# Patient Record
Sex: Male | Born: 1955 | Race: White | Hispanic: No | Marital: Married
Health system: Southern US, Community
[De-identification: ages and names within clinical notes are randomized; demographics above are authoritative.]

---

## 1997-11-13 ENCOUNTER — Other Ambulatory Visit: Admission: RE | Admit: 1997-11-13 | Discharge: 1997-11-13 | Payer: Self-pay | Admitting: Orthopedic Surgery

## 1997-12-05 ENCOUNTER — Ambulatory Visit (HOSPITAL_BASED_OUTPATIENT_CLINIC_OR_DEPARTMENT_OTHER): Admission: RE | Admit: 1997-12-05 | Discharge: 1997-12-05 | Payer: Self-pay | Admitting: Orthopedic Surgery

## 2002-12-10 ENCOUNTER — Emergency Department (HOSPITAL_COMMUNITY): Admission: EM | Admit: 2002-12-10 | Discharge: 2002-12-10 | Payer: Self-pay | Admitting: Emergency Medicine

## 2002-12-10 ENCOUNTER — Encounter: Payer: Self-pay | Admitting: Emergency Medicine

## 2004-12-31 ENCOUNTER — Emergency Department (HOSPITAL_COMMUNITY): Admission: EM | Admit: 2004-12-31 | Discharge: 2004-12-31 | Payer: Self-pay | Admitting: Emergency Medicine

## 2005-01-01 ENCOUNTER — Emergency Department (HOSPITAL_COMMUNITY): Admission: EM | Admit: 2005-01-01 | Discharge: 2005-01-01 | Payer: Self-pay | Admitting: Emergency Medicine

## 2005-03-29 ENCOUNTER — Emergency Department (HOSPITAL_COMMUNITY): Admission: EM | Admit: 2005-03-29 | Discharge: 2005-03-29 | Payer: Medicare Other | Admitting: Emergency Medicine

## 2016-05-01 DIAGNOSIS — M109 Gout, unspecified: Secondary | ICD-10-CM

## 2016-05-01 DIAGNOSIS — I10 Essential (primary) hypertension: Secondary | ICD-10-CM | POA: Diagnosis not present

## 2016-05-01 DIAGNOSIS — E785 Hyperlipidemia, unspecified: Secondary | ICD-10-CM

## 2016-05-01 DIAGNOSIS — J9601 Acute respiratory failure with hypoxia: Secondary | ICD-10-CM

## 2016-05-01 DIAGNOSIS — J40 Bronchitis, not specified as acute or chronic: Secondary | ICD-10-CM | POA: Diagnosis not present

## 2016-05-01 DIAGNOSIS — I509 Heart failure, unspecified: Secondary | ICD-10-CM

## 2016-05-01 DIAGNOSIS — J441 Chronic obstructive pulmonary disease with (acute) exacerbation: Secondary | ICD-10-CM | POA: Diagnosis not present

## 2016-05-02 DIAGNOSIS — J9601 Acute respiratory failure with hypoxia: Secondary | ICD-10-CM | POA: Diagnosis not present

## 2016-05-02 DIAGNOSIS — I10 Essential (primary) hypertension: Secondary | ICD-10-CM | POA: Diagnosis not present

## 2016-05-02 DIAGNOSIS — J441 Chronic obstructive pulmonary disease with (acute) exacerbation: Secondary | ICD-10-CM | POA: Diagnosis not present

## 2016-05-02 DIAGNOSIS — J40 Bronchitis, not specified as acute or chronic: Secondary | ICD-10-CM | POA: Diagnosis not present

## 2016-05-03 DIAGNOSIS — I10 Essential (primary) hypertension: Secondary | ICD-10-CM | POA: Diagnosis not present

## 2016-05-03 DIAGNOSIS — J40 Bronchitis, not specified as acute or chronic: Secondary | ICD-10-CM | POA: Diagnosis not present

## 2016-05-03 DIAGNOSIS — J441 Chronic obstructive pulmonary disease with (acute) exacerbation: Secondary | ICD-10-CM | POA: Diagnosis not present

## 2016-05-03 DIAGNOSIS — J9601 Acute respiratory failure with hypoxia: Secondary | ICD-10-CM | POA: Diagnosis not present

## 2019-01-23 ENCOUNTER — Emergency Department (HOSPITAL_COMMUNITY): Payer: Medicare Other

## 2019-01-23 ENCOUNTER — Inpatient Hospital Stay (HOSPITAL_COMMUNITY)
Admission: EM | Admit: 2019-01-23 | Discharge: 2019-01-27 | DRG: 958 | Disposition: A | Discharge status: Home Health | Payer: Medicare Other | Attending: Student | Admitting: Student

## 2019-01-23 ENCOUNTER — Encounter (HOSPITAL_COMMUNITY): Payer: Self-pay | Admitting: Emergency Medicine

## 2019-01-23 ENCOUNTER — Inpatient Hospital Stay (HOSPITAL_COMMUNITY): Payer: Medicare Other

## 2019-01-23 DIAGNOSIS — D62 Acute posthemorrhagic anemia: Secondary | ICD-10-CM | POA: Diagnosis not present

## 2019-01-23 DIAGNOSIS — S060X1A Concussion with loss of consciousness of 30 minutes or less, initial encounter: Secondary | ICD-10-CM | POA: Diagnosis present

## 2019-01-23 DIAGNOSIS — F17210 Nicotine dependence, cigarettes, uncomplicated: Secondary | ICD-10-CM | POA: Diagnosis present

## 2019-01-23 DIAGNOSIS — T1490XA Injury, unspecified, initial encounter: Secondary | ICD-10-CM | POA: Diagnosis present

## 2019-01-23 DIAGNOSIS — M109 Gout, unspecified: Secondary | ICD-10-CM | POA: Diagnosis present

## 2019-01-23 DIAGNOSIS — Y92410 Unspecified street and highway as the place of occurrence of the external cause: Secondary | ICD-10-CM | POA: Diagnosis not present

## 2019-01-23 DIAGNOSIS — Z23 Encounter for immunization: Secondary | ICD-10-CM | POA: Diagnosis present

## 2019-01-23 DIAGNOSIS — S42023A Displaced fracture of shaft of unspecified clavicle, initial encounter for closed fracture: Secondary | ICD-10-CM

## 2019-01-23 DIAGNOSIS — S2242XA Multiple fractures of ribs, left side, initial encounter for closed fracture: Secondary | ICD-10-CM | POA: Diagnosis present

## 2019-01-23 DIAGNOSIS — Z20828 Contact with and (suspected) exposure to other viral communicable diseases: Secondary | ICD-10-CM | POA: Diagnosis present

## 2019-01-23 DIAGNOSIS — S42002A Fracture of unspecified part of left clavicle, initial encounter for closed fracture: Secondary | ICD-10-CM

## 2019-01-23 DIAGNOSIS — J939 Pneumothorax, unspecified: Secondary | ICD-10-CM

## 2019-01-23 DIAGNOSIS — S72112A Displaced fracture of greater trochanter of left femur, initial encounter for closed fracture: Secondary | ICD-10-CM | POA: Diagnosis present

## 2019-01-23 DIAGNOSIS — E785 Hyperlipidemia, unspecified: Secondary | ICD-10-CM | POA: Diagnosis present

## 2019-01-23 DIAGNOSIS — S0101XA Laceration without foreign body of scalp, initial encounter: Secondary | ICD-10-CM | POA: Diagnosis present

## 2019-01-23 DIAGNOSIS — S270XXA Traumatic pneumothorax, initial encounter: Secondary | ICD-10-CM | POA: Diagnosis present

## 2019-01-23 DIAGNOSIS — T148XXA Other injury of unspecified body region, initial encounter: Secondary | ICD-10-CM

## 2019-01-23 DIAGNOSIS — I10 Essential (primary) hypertension: Secondary | ICD-10-CM | POA: Diagnosis present

## 2019-01-23 DIAGNOSIS — S42022A Displaced fracture of shaft of left clavicle, initial encounter for closed fracture: Secondary | ICD-10-CM | POA: Diagnosis present

## 2019-01-23 LAB — URINALYSIS, ROUTINE W REFLEX MICROSCOPIC
Bacteria, UA: NONE SEEN
Bilirubin Urine: NEGATIVE
Glucose, UA: NEGATIVE mg/dL
Ketones, ur: NEGATIVE mg/dL
Leukocytes,Ua: NEGATIVE
Nitrite: NEGATIVE
Protein, ur: NEGATIVE mg/dL
Specific Gravity, Urine: 1.011 (ref 1.005–1.030)
pH: 7 (ref 5.0–8.0)

## 2019-01-23 LAB — CBC
HCT: 39.4 % (ref 39.0–52.0)
HCT: 35.8 % — ABNORMAL LOW (ref 39.0–52.0)
Hemoglobin: 13.6 g/dL (ref 13.0–17.0)
Hemoglobin: 12.2 g/dL — ABNORMAL LOW (ref 13.0–17.0)
MCH: 32.5 pg (ref 26.0–34.0)
MCH: 32.5 pg (ref 26.0–34.0)
MCHC: 34.5 g/dL (ref 30.0–36.0)
MCHC: 34.1 g/dL (ref 30.0–36.0)
MCV: 94 fL (ref 80.0–100.0)
MCV: 95.5 fL (ref 80.0–100.0)
Platelets: 290 10*3/uL (ref 150–400)
Platelets: 265 10*3/uL (ref 150–400)
RBC: 4.19 MIL/uL — ABNORMAL LOW (ref 4.22–5.81)
RBC: 3.75 MIL/uL — ABNORMAL LOW (ref 4.22–5.81)
RDW: 12.3 % (ref 11.5–15.5)
RDW: 12.5 % (ref 11.5–15.5)
WBC: 13.2 10*3/uL — ABNORMAL HIGH (ref 4.0–10.5)
WBC: 18.4 10*3/uL — ABNORMAL HIGH (ref 4.0–10.5)
nRBC: 0 % (ref 0.0–0.2)
nRBC: 0 % (ref 0.0–0.2)

## 2019-01-23 LAB — I-STAT CHEM 8, ED
BUN: 25 mg/dL — ABNORMAL HIGH (ref 8–23)
Calcium, Ion: 1.26 mmol/L (ref 1.15–1.40)
Chloride: 105 mmol/L (ref 98–111)
Creatinine, Ser: 1.4 mg/dL — ABNORMAL HIGH (ref 0.61–1.24)
Glucose, Bld: 105 mg/dL — ABNORMAL HIGH (ref 70–99)
HCT: 40 % (ref 39.0–52.0)
Hemoglobin: 13.6 g/dL (ref 13.0–17.0)
Potassium: 4 mmol/L (ref 3.5–5.1)
Sodium: 139 mmol/L (ref 135–145)
TCO2: 25 mmol/L (ref 22–32)

## 2019-01-23 LAB — COMPREHENSIVE METABOLIC PANEL
ALT: 22 U/L (ref 0–44)
AST: 30 U/L (ref 15–41)
Albumin: 3.7 g/dL (ref 3.5–5.0)
Alkaline Phosphatase: 69 U/L (ref 38–126)
Anion gap: 12 (ref 5–15)
BUN: 23 mg/dL (ref 8–23)
CO2: 23 mmol/L (ref 22–32)
Calcium: 9.8 mg/dL (ref 8.9–10.3)
Chloride: 105 mmol/L (ref 98–111)
Creatinine, Ser: 1.55 mg/dL — ABNORMAL HIGH (ref 0.61–1.24)
GFR calc Af Amer: 54 mL/min — ABNORMAL LOW (ref >60)
GFR calc non Af Amer: 46 mL/min — ABNORMAL LOW (ref >60)
Glucose, Bld: 107 mg/dL — ABNORMAL HIGH (ref 70–99)
Potassium: 4.2 mmol/L (ref 3.5–5.1)
Sodium: 140 mmol/L (ref 135–145)
Total Bilirubin: 1.3 mg/dL — ABNORMAL HIGH (ref 0.3–1.2)
Total Protein: 6.3 g/dL — ABNORMAL LOW (ref 6.5–8.1)

## 2019-01-23 LAB — SARS CORONAVIRUS 2 (TAT 6-24 HRS): SARS Coronavirus 2: NEGATIVE

## 2019-01-23 LAB — CREATININE, SERUM
Creatinine, Ser: 1.38 mg/dL — ABNORMAL HIGH (ref 0.61–1.24)
GFR calc Af Amer: >60 mL/min (ref >60)
GFR calc non Af Amer: 53 mL/min — ABNORMAL LOW (ref >60)

## 2019-01-23 LAB — CDS SEROLOGY

## 2019-01-23 LAB — SAMPLE TO BLOOD BANK

## 2019-01-23 LAB — PROTIME-INR
INR: 1.1 (ref 0.8–1.2)
Prothrombin Time: 14.3 seconds (ref 11.4–15.2)

## 2019-01-23 LAB — LACTIC ACID, PLASMA: Lactic Acid, Venous: 1.4 mmol/L (ref 0.5–1.9)

## 2019-01-23 MED ORDER — MORPHINE SULFATE (PF) 4 MG/ML IV SOLN
4.0000 mg | Freq: Once | INTRAVENOUS | Status: AC
  Administered 2019-01-23: 4 mg via INTRAVENOUS
  Filled 2019-01-23: qty 1

## 2019-01-23 MED ORDER — METOPROLOL TARTRATE 5 MG/5ML IV SOLN: 5.0000 mg | Freq: Four times a day (QID) | INTRAVENOUS | Status: DC | PRN

## 2019-01-23 MED ORDER — ONDANSETRON 4 MG PO TBDP: 4.0000 mg | ORAL_TABLET | Freq: Four times a day (QID) | ORAL | Status: DC | PRN

## 2019-01-23 MED ORDER — ENOXAPARIN SODIUM 40 MG/0.4ML ~~LOC~~ SOLN
40.0000 mg | SUBCUTANEOUS | Status: DC
  Administered 2019-01-23 – 2019-01-26 (×3): 40 mg via SUBCUTANEOUS
  Filled 2019-01-23 (×3): qty 0.4

## 2019-01-23 MED ORDER — IOHEXOL 300 MG/ML  SOLN
100.0000 mL | Freq: Once | INTRAMUSCULAR | Status: AC | PRN
  Administered 2019-01-23: 100 mL via INTRAVENOUS

## 2019-01-23 MED ORDER — CEFAZOLIN SODIUM-DEXTROSE 1-4 GM/50ML-% IV SOLN
1.0000 g | Freq: Once | INTRAVENOUS | Status: AC
  Administered 2019-01-23: 1 g via INTRAVENOUS
  Filled 2019-01-23: qty 50

## 2019-01-23 MED ORDER — ONDANSETRON HCL 4 MG/2ML IJ SOLN: 4.0000 mg | Freq: Four times a day (QID) | INTRAMUSCULAR | Status: DC | PRN

## 2019-01-23 MED ORDER — HYDROMORPHONE HCL 1 MG/ML IJ SOLN
0.5000 mg | Freq: Once | INTRAMUSCULAR | Status: AC
  Administered 2019-01-23: 0.5 mg via INTRAVENOUS
  Filled 2019-01-23: qty 1

## 2019-01-23 MED ORDER — TETANUS-DIPHTH-ACELL PERTUSSIS 5-2.5-18.5 LF-MCG/0.5 IM SUSP
0.5000 mL | Freq: Once | INTRAMUSCULAR | Status: AC
  Administered 2019-01-23: 0.5 mL via INTRAMUSCULAR
  Filled 2019-01-23: qty 0.5

## 2019-01-23 MED ORDER — PANTOPRAZOLE SODIUM 40 MG IV SOLR
40.0000 mg | Freq: Every day | INTRAVENOUS | Status: DC
  Administered 2019-01-23: 40 mg via INTRAVENOUS
  Filled 2019-01-23 (×2): qty 40

## 2019-01-23 MED ORDER — KCL IN DEXTROSE-NACL 20-5-0.45 MEQ/L-%-% IV SOLN
INTRAVENOUS | Status: DC
  Administered 2019-01-23 – 2019-01-26 (×4): via INTRAVENOUS
  Filled 2019-01-23 (×4): qty 1000

## 2019-01-23 MED ORDER — MORPHINE SULFATE (PF) 2 MG/ML IV SOLN
1.0000 mg | INTRAVENOUS | Status: DC | PRN
  Administered 2019-01-24 (×2): 2 mg via INTRAVENOUS
  Filled 2019-01-23 (×2): qty 1

## 2019-01-23 MED ORDER — DOCUSATE SODIUM 100 MG PO CAPS
100.0000 mg | ORAL_CAPSULE | Freq: Two times a day (BID) | ORAL | Status: DC
  Administered 2019-01-23 – 2019-01-27 (×8): 100 mg via ORAL
  Filled 2019-01-23 (×8): qty 1

## 2019-01-23 MED ORDER — PANTOPRAZOLE SODIUM 40 MG PO TBEC
40.0000 mg | DELAYED_RELEASE_TABLET | Freq: Every day | ORAL | Status: DC
  Administered 2019-01-24 – 2019-01-27 (×4): 40 mg via ORAL
  Filled 2019-01-23 (×5): qty 1

## 2019-01-23 MED ORDER — OXYCODONE HCL 5 MG PO TABS
5.0000 mg | ORAL_TABLET | ORAL | Status: DC | PRN
  Administered 2019-01-24 – 2019-01-27 (×7): 10 mg via ORAL
  Filled 2019-01-23 (×7): qty 2

## 2019-01-23 MED ORDER — SODIUM CHLORIDE 0.9 % IV BOLUS
125.0000 mL | Freq: Once | INTRAVENOUS | Status: AC
  Administered 2019-01-23: 125 mL via INTRAVENOUS

## 2019-01-23 MED ORDER — ACETAMINOPHEN 325 MG PO TABS
650.0000 mg | ORAL_TABLET | Freq: Four times a day (QID) | ORAL | Status: DC
  Administered 2019-01-23 – 2019-01-27 (×14): 650 mg via ORAL
  Filled 2019-01-23 (×15): qty 2

## 2019-01-23 NOTE — ED Notes (Signed)
Pt transported to CT ?

## 2019-01-23 NOTE — ED Notes (Addendum)
DAUGHTER PLEASE CALL IF NEEDS ANYTHING [phone] AMANDA

## 2019-01-23 NOTE — ED Notes (Signed)
Patient nephew calling asking for an update on patient would like a call back  Hart Carwin [phone]
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
## 2019-01-23 NOTE — ED Notes (Signed)
Was able to call patients niece for him to inform he was here by pts request. Pt does not have his daughters number but niece states she will call her.
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
## 2019-01-23 NOTE — ED Notes (Signed)
MS 
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
## 2019-01-24 ENCOUNTER — Inpatient Hospital Stay (HOSPITAL_COMMUNITY): Payer: Medicare Other

## 2019-01-24 ENCOUNTER — Encounter (HOSPITAL_COMMUNITY): Admission: EM | Disposition: A | Discharge status: Home Health | Payer: Self-pay | Source: Home / Self Care

## 2019-01-24 ENCOUNTER — Encounter (HOSPITAL_COMMUNITY): Payer: Self-pay

## 2019-01-24 ENCOUNTER — Inpatient Hospital Stay (HOSPITAL_COMMUNITY): Payer: Medicare Other | Admitting: Anesthesiology

## 2019-01-24 ENCOUNTER — Other Ambulatory Visit: Payer: Self-pay

## 2019-01-24 DIAGNOSIS — S42002A Fracture of unspecified part of left clavicle, initial encounter for closed fracture: Secondary | ICD-10-CM

## 2019-01-24 HISTORY — PX: ORIF CLAVICULAR FRACTURE: SHX5055

## 2019-01-24 LAB — BASIC METABOLIC PANEL
Anion gap: 9 (ref 5–15)
BUN: 20 mg/dL (ref 8–23)
CO2: 21 mmol/L — ABNORMAL LOW (ref 22–32)
Calcium: 8.9 mg/dL (ref 8.9–10.3)
Chloride: 105 mmol/L (ref 98–111)
Creatinine, Ser: 1.4 mg/dL — ABNORMAL HIGH (ref 0.61–1.24)
GFR calc Af Amer: >60 mL/min (ref >60)
GFR calc non Af Amer: 52 mL/min — ABNORMAL LOW (ref >60)
Glucose, Bld: 136 mg/dL — ABNORMAL HIGH (ref 70–99)
Potassium: 3.8 mmol/L (ref 3.5–5.1)
Sodium: 135 mmol/L (ref 135–145)

## 2019-01-24 LAB — SURGICAL PCR SCREEN
MRSA, PCR: NEGATIVE
Staphylococcus aureus: NEGATIVE

## 2019-01-24 LAB — ETHANOL: Alcohol, Ethyl (B): <10 mg/dL (ref <10)

## 2019-01-24 LAB — CBC
HCT: 34.8 % — ABNORMAL LOW (ref 39.0–52.0)
Hemoglobin: 12.1 g/dL — ABNORMAL LOW (ref 13.0–17.0)
MCH: 32.5 pg (ref 26.0–34.0)
MCHC: 34.8 g/dL (ref 30.0–36.0)
MCV: 93.5 fL (ref 80.0–100.0)
Platelets: 255 10*3/uL (ref 150–400)
RBC: 3.72 MIL/uL — ABNORMAL LOW (ref 4.22–5.81)
RDW: 12.4 % (ref 11.5–15.5)
WBC: 11.3 10*3/uL — ABNORMAL HIGH (ref 4.0–10.5)
nRBC: 0 % (ref 0.0–0.2)

## 2019-01-24 LAB — HIV ANTIBODY (ROUTINE TESTING W REFLEX): HIV Screen 4th Generation wRfx: NONREACTIVE

## 2019-01-24 SURGERY — OPEN REDUCTION INTERNAL FIXATION (ORIF) CLAVICULAR FRACTURE
Anesthesia: General | Laterality: Left

## 2019-01-24 MED ORDER — METHOCARBAMOL 500 MG PO TABS
500.0000 mg | ORAL_TABLET | Freq: Four times a day (QID) | ORAL | Status: DC
  Administered 2019-01-24 – 2019-01-27 (×12): 500 mg via ORAL
  Filled 2019-01-24 (×12): qty 1

## 2019-01-24 MED ORDER — ONDANSETRON HCL 4 MG/2ML IJ SOLN
INTRAMUSCULAR | Status: AC
  Filled 2019-01-24: qty 2

## 2019-01-24 MED ORDER — 0.9 % SODIUM CHLORIDE (POUR BTL) OPTIME
TOPICAL | Status: DC | PRN
  Administered 2019-01-24: 09:00:00 1000 mL

## 2019-01-24 MED ORDER — DEXAMETHASONE SODIUM PHOSPHATE 10 MG/ML IJ SOLN
INTRAMUSCULAR | Status: AC
  Filled 2019-01-24: qty 1

## 2019-01-24 MED ORDER — SUGAMMADEX SODIUM 200 MG/2ML IV SOLN
INTRAVENOUS | Status: DC | PRN
  Administered 2019-01-24: 163.2 mg via INTRAVENOUS

## 2019-01-24 MED ORDER — CEFAZOLIN SODIUM-DEXTROSE 2-4 GM/100ML-% IV SOLN
2.0000 g | Freq: Three times a day (TID) | INTRAVENOUS | Status: AC
  Administered 2019-01-24 – 2019-01-25 (×3): 2 g via INTRAVENOUS
  Filled 2019-01-24 (×3): qty 100

## 2019-01-24 MED ORDER — MIDAZOLAM HCL 2 MG/2ML IJ SOLN
INTRAMUSCULAR | Status: DC | PRN
  Administered 2019-01-24: 2 mg via INTRAVENOUS

## 2019-01-24 MED ORDER — MIDAZOLAM HCL 2 MG/2ML IJ SOLN: 0.5000 mg | Freq: Once | INTRAMUSCULAR | Status: DC | PRN

## 2019-01-24 MED ORDER — HYDROMORPHONE HCL 1 MG/ML IJ SOLN
0.2500 mg | INTRAMUSCULAR | Status: DC | PRN
  Administered 2019-01-24: 11:00:00 0.25 mg via INTRAVENOUS

## 2019-01-24 MED ORDER — ONDANSETRON HCL 4 MG/2ML IJ SOLN
INTRAMUSCULAR | Status: DC | PRN
  Administered 2019-01-24: 4 mg via INTRAVENOUS

## 2019-01-24 MED ORDER — DULOXETINE HCL 30 MG PO CPEP: 30.0000 mg | ORAL_CAPSULE | Freq: Every day | ORAL | Status: DC

## 2019-01-24 MED ORDER — FENTANYL CITRATE (PF) 250 MCG/5ML IJ SOLN
INTRAMUSCULAR | Status: AC
  Filled 2019-01-24: qty 5

## 2019-01-24 MED ORDER — VANCOMYCIN HCL 1000 MG IV SOLR
INTRAVENOUS | Status: AC
  Filled 2019-01-24: qty 1000

## 2019-01-24 MED ORDER — PNEUMOCOCCAL VAC POLYVALENT 25 MCG/0.5ML IJ INJ
0.5000 mL | INJECTION | INTRAMUSCULAR | Status: AC
  Administered 2019-01-25: 0.5 mL via INTRAMUSCULAR
  Filled 2019-01-24: qty 0.5

## 2019-01-24 MED ORDER — HYDRALAZINE HCL 50 MG PO TABS
50.0000 mg | ORAL_TABLET | Freq: Two times a day (BID) | ORAL | Status: DC
  Administered 2019-01-24 – 2019-01-27 (×6): 50 mg via ORAL
  Filled 2019-01-24 (×6): qty 1

## 2019-01-24 MED ORDER — PROPOFOL 10 MG/ML IV BOLUS
INTRAVENOUS | Status: AC
  Filled 2019-01-24: qty 40

## 2019-01-24 MED ORDER — EPHEDRINE SULFATE-NACL 50-0.9 MG/10ML-% IV SOSY
PREFILLED_SYRINGE | INTRAVENOUS | Status: DC | PRN
  Administered 2019-01-24: 5 mg via INTRAVENOUS

## 2019-01-24 MED ORDER — ROCURONIUM BROMIDE 10 MG/ML (PF) SYRINGE
PREFILLED_SYRINGE | INTRAVENOUS | Status: AC
  Filled 2019-01-24: qty 10

## 2019-01-24 MED ORDER — LACTATED RINGERS IV SOLN
INTRAVENOUS | Status: DC | PRN
  Administered 2019-01-24 (×2): via INTRAVENOUS

## 2019-01-24 MED ORDER — METOPROLOL TARTRATE 50 MG PO TABS
50.0000 mg | ORAL_TABLET | Freq: Two times a day (BID) | ORAL | Status: DC
  Administered 2019-01-24 – 2019-01-27 (×5): 50 mg via ORAL
  Filled 2019-01-24 (×7): qty 1

## 2019-01-24 MED ORDER — AMLODIPINE BESYLATE 10 MG PO TABS
10.0000 mg | ORAL_TABLET | Freq: Every day | ORAL | Status: DC
  Administered 2019-01-24 – 2019-01-27 (×4): 10 mg via ORAL
  Filled 2019-01-24 (×4): qty 1

## 2019-01-24 MED ORDER — DEXAMETHASONE SODIUM PHOSPHATE 10 MG/ML IJ SOLN
INTRAMUSCULAR | Status: DC | PRN
  Administered 2019-01-24: 5 mg via INTRAVENOUS

## 2019-01-24 MED ORDER — FENTANYL CITRATE (PF) 250 MCG/5ML IJ SOLN
INTRAMUSCULAR | Status: DC | PRN
  Administered 2019-01-24: 50 ug via INTRAVENOUS
  Administered 2019-01-24 (×2): 100 ug via INTRAVENOUS

## 2019-01-24 MED ORDER — GABAPENTIN 100 MG PO CAPS
100.0000 mg | ORAL_CAPSULE | Freq: Three times a day (TID) | ORAL | Status: DC
  Administered 2019-01-24 – 2019-01-27 (×9): 100 mg via ORAL
  Filled 2019-01-24 (×9): qty 1

## 2019-01-24 MED ORDER — INFLUENZA VAC A&B SA ADJ QUAD 0.5 ML IM PRSY
0.5000 mL | PREFILLED_SYRINGE | INTRAMUSCULAR | Status: AC
  Administered 2019-01-25: 0.5 mL via INTRAMUSCULAR
  Filled 2019-01-24: qty 0.5

## 2019-01-24 MED ORDER — CEFAZOLIN SODIUM 1 G IJ SOLR
INTRAMUSCULAR | Status: AC
  Filled 2019-01-24: qty 20

## 2019-01-24 MED ORDER — MIDAZOLAM HCL 2 MG/2ML IJ SOLN
INTRAMUSCULAR | Status: AC
  Filled 2019-01-24: qty 2

## 2019-01-24 MED ORDER — VANCOMYCIN HCL 1000 MG IV SOLR
INTRAVENOUS | Status: DC | PRN
  Administered 2019-01-24: 1000 mg via TOPICAL

## 2019-01-24 MED ORDER — CLONIDINE HCL 0.2 MG PO TABS
0.2000 mg | ORAL_TABLET | Freq: Every day | ORAL | Status: DC
  Administered 2019-01-24 – 2019-01-25 (×2): 0.2 mg via ORAL
  Filled 2019-01-24 (×3): qty 1

## 2019-01-24 MED ORDER — PROPOFOL 10 MG/ML IV BOLUS
INTRAVENOUS | Status: DC | PRN
  Administered 2019-01-24: 120 mg via INTRAVENOUS

## 2019-01-24 MED ORDER — CLONIDINE HCL 0.1 MG PO TABS
0.1000 mg | ORAL_TABLET | Freq: Every day | ORAL | Status: DC
  Administered 2019-01-25 – 2019-01-27 (×3): 0.1 mg via ORAL
  Filled 2019-01-24 (×3): qty 1

## 2019-01-24 MED ORDER — MEPERIDINE HCL 25 MG/ML IJ SOLN: 6.2500 mg | INTRAMUSCULAR | Status: DC | PRN

## 2019-01-24 MED ORDER — MORPHINE SULFATE (PF) 2 MG/ML IV SOLN: 2.0000 mg | INTRAVENOUS | Status: DC | PRN

## 2019-01-24 MED ORDER — CEFAZOLIN SODIUM-DEXTROSE 2-3 GM-%(50ML) IV SOLR
INTRAVENOUS | Status: DC | PRN
  Administered 2019-01-24: 2 g via INTRAVENOUS

## 2019-01-24 MED ORDER — DOXAZOSIN MESYLATE 2 MG PO TABS
2.0000 mg | ORAL_TABLET | Freq: Two times a day (BID) | ORAL | Status: DC
  Administered 2019-01-24 – 2019-01-27 (×5): 2 mg via ORAL
  Filled 2019-01-24 (×7): qty 1

## 2019-01-24 MED ORDER — ROCURONIUM BROMIDE 10 MG/ML (PF) SYRINGE
PREFILLED_SYRINGE | INTRAVENOUS | Status: DC | PRN
  Administered 2019-01-24: 50 mg via INTRAVENOUS

## 2019-01-24 MED ORDER — ISOSORBIDE MONONITRATE ER 60 MG PO TB24
60.0000 mg | ORAL_TABLET | Freq: Every day | ORAL | Status: DC
  Administered 2019-01-24 – 2019-01-27 (×4): 60 mg via ORAL
  Filled 2019-01-24 (×4): qty 1

## 2019-01-24 MED ORDER — PROMETHAZINE HCL 25 MG/ML IJ SOLN: 6.2500 mg | INTRAMUSCULAR | Status: DC | PRN

## 2019-01-24 MED ORDER — METHOCARBAMOL 500 MG PO TABS
ORAL_TABLET | ORAL | Status: AC
  Administered 2019-01-24: 500 mg via ORAL
  Filled 2019-01-24: qty 1

## 2019-01-24 MED ORDER — BUPIVACAINE-EPINEPHRINE (PF) 0.5% -1:200000 IJ SOLN
INTRAMUSCULAR | Status: AC
  Filled 2019-01-24: qty 30

## 2019-01-24 MED ORDER — HYDROMORPHONE HCL 1 MG/ML IJ SOLN
INTRAMUSCULAR | Status: AC
  Administered 2019-01-24: 11:00:00 0.25 mg via INTRAVENOUS
  Filled 2019-01-24: qty 1

## 2019-01-24 MED ORDER — METHOCARBAMOL 500 MG PO TABS
500.0000 mg | ORAL_TABLET | Freq: Four times a day (QID) | ORAL | Status: DC | PRN
  Administered 2019-01-24: 11:00:00 500 mg via ORAL

## 2019-01-24 MED ORDER — CLONIDINE HCL 0.1 MG PO TABS: 0.1000 mg | ORAL_TABLET | ORAL | Status: DC

## 2019-01-24 MED ORDER — ATORVASTATIN CALCIUM 10 MG PO TABS
10.0000 mg | ORAL_TABLET | Freq: Every day | ORAL | Status: DC
  Administered 2019-01-24 – 2019-01-26 (×3): 10 mg via ORAL
  Filled 2019-01-24 (×3): qty 1

## 2019-01-24 MED ORDER — FEBUXOSTAT 40 MG PO TABS
80.0000 mg | ORAL_TABLET | Freq: Every day | ORAL | Status: DC
  Administered 2019-01-24 – 2019-01-27 (×4): 80 mg via ORAL
  Filled 2019-01-24 (×4): qty 2

## 2019-01-24 MED ORDER — LIDOCAINE 2% (20 MG/ML) 5 ML SYRINGE
INTRAMUSCULAR | Status: AC
  Filled 2019-01-24: qty 5

## 2019-01-24 SURGICAL SUPPLY — 58 items
ADH SKN CLS APL DERMABOND .7 (GAUZE/BANDAGES/DRESSINGS) ×2
APL PRP STRL LF DISP 70% ISPRP (MISCELLANEOUS) ×1
BIT DRILL QC 2.5X135 (BIT) ×1 IMPLANT
BIT DRILL QC 2.8X135 (BIT) ×1 IMPLANT
BNDG COHESIVE 4X5 TAN STRL (GAUZE/BANDAGES/DRESSINGS) IMPLANT
BRUSH SCRUB EZ PLAIN DRY (MISCELLANEOUS) ×4 IMPLANT
CHLORAPREP W/TINT 26 (MISCELLANEOUS) ×2 IMPLANT
CLSR STERI-STRIP ANTIMIC 1/2X4 (GAUZE/BANDAGES/DRESSINGS) ×1 IMPLANT
COVER SURGICAL LIGHT HANDLE (MISCELLANEOUS) ×4 IMPLANT
DERMABOND ADVANCED (GAUZE/BANDAGES/DRESSINGS) ×2
DERMABOND ADVANCED .7 DNX12 (GAUZE/BANDAGES/DRESSINGS) ×2 IMPLANT
DRAPE C-ARM 42X72 X-RAY (DRAPES) ×2 IMPLANT
DRAPE INCISE IOBAN 66X45 STRL (DRAPES) ×3 IMPLANT
DRAPE ORTHO SPLIT 77X108 STRL (DRAPES) ×4
DRAPE SURG ORHT 6 SPLT 77X108 (DRAPES) ×2 IMPLANT
DRAPE U-SHAPE 47X51 STRL (DRAPES) ×4 IMPLANT
DRSG MEPILEX BORDER 4X8 (GAUZE/BANDAGES/DRESSINGS) ×2 IMPLANT
ELECT REM PT RETURN 9FT ADLT (ELECTROSURGICAL) ×2
ELECTRODE REM PT RTRN 9FT ADLT (ELECTROSURGICAL) ×1 IMPLANT
GLOVE BIO SURGEON STRL SZ 6.5 (GLOVE) ×6 IMPLANT
GLOVE BIO SURGEON STRL SZ7 (GLOVE) ×3 IMPLANT
GLOVE BIO SURGEON STRL SZ7.5 (GLOVE) ×6 IMPLANT
GLOVE BIOGEL PI IND STRL 6.5 (GLOVE) ×1 IMPLANT
GLOVE BIOGEL PI IND STRL 7.0 (GLOVE) IMPLANT
GLOVE BIOGEL PI IND STRL 7.5 (GLOVE) ×1 IMPLANT
GLOVE BIOGEL PI INDICATOR 6.5 (GLOVE) ×1
GLOVE BIOGEL PI INDICATOR 7.0 (GLOVE) ×1
GLOVE BIOGEL PI INDICATOR 7.5 (GLOVE) ×1
GOWN STRL REUS W/ TWL LRG LVL3 (GOWN DISPOSABLE) ×2 IMPLANT
GOWN STRL REUS W/TWL LRG LVL3 (GOWN DISPOSABLE) ×4
KIT BASIN OR (CUSTOM PROCEDURE TRAY) ×2 IMPLANT
KIT TURNOVER KIT B (KITS) ×2 IMPLANT
NS IRRIG 1000ML POUR BTL (IV SOLUTION) ×2 IMPLANT
PACK GENERAL/GYN (CUSTOM PROCEDURE TRAY) ×2 IMPLANT
PAD ARMBOARD 7.5X6 YLW CONV (MISCELLANEOUS) ×4 IMPLANT
PLATE CLAVICLE 3.5X100 7H LFT (Plate) ×1 IMPLANT
SCREW CORTEX 3.5 12MM (Screw) ×1 IMPLANT
SCREW CORTEX 3.5 14MM (Screw) ×2 IMPLANT
SCREW CORTEX 3.5 16MM (Screw) ×3 IMPLANT
SCREW CORTEX 3.5 18MM (Screw) ×3 IMPLANT
SCREW LOCK CORT ST 3.5X12 (Screw) IMPLANT
SCREW LOCK CORT ST 3.5X14 (Screw) IMPLANT
SCREW LOCK CORT ST 3.5X16 (Screw) IMPLANT
SCREW LOCK CORT ST 3.5X18 (Screw) IMPLANT
SCREW LOCK T15 FT 14X3.5X2.9X (Screw) IMPLANT
SCREW LOCKING 3.5X14 (Screw) ×2 IMPLANT
SLING ARM IMMOBILIZER LRG (SOFTGOODS) IMPLANT
STAPLER VISISTAT 35W (STAPLE) ×2 IMPLANT
SUCTION FRAZIER HANDLE 10FR (MISCELLANEOUS) ×1
SUCTION TUBE FRAZIER 10FR DISP (MISCELLANEOUS) ×1 IMPLANT
SUT MNCRL AB 3-0 PS2 27 (SUTURE) ×2 IMPLANT
SUT VIC AB 0 CT1 27 (SUTURE) ×2
SUT VIC AB 0 CT1 27XBRD ANBCTR (SUTURE) ×1 IMPLANT
SUT VIC AB 2-0 CT1 27 (SUTURE) ×2
SUT VIC AB 2-0 CT1 TAPERPNT 27 (SUTURE) ×1 IMPLANT
SYR CONTROL 10ML LL (SYRINGE) IMPLANT
TOWEL GREEN STERILE (TOWEL DISPOSABLE) ×2 IMPLANT
WATER STERILE IRR 1000ML POUR (IV SOLUTION) ×2 IMPLANT

## 2019-01-24 NOTE — Anesthesia Procedure Notes (Signed)
Procedure Name: Intubation Date/Time: 01/24/2019 8:07 AM Performed by: Kathryne Hitch, CRNA Pre-anesthesia Checklist: Patient identified, Emergency Drugs available, Suction available and Patient being monitored Patient Re-evaluated:Patient Re-evaluated prior to induction Oxygen Delivery Method: Circle system utilized Preoxygenation: Pre-oxygenation with 100% oxygen Induction Type: IV induction Ventilation: Mask ventilation without difficulty and Oral airway inserted - appropriate to patient size Laryngoscope Size: Sabra Heck and 2 Grade View: Grade I Tube type: Oral Tube size: 7.5 mm Number of attempts: 1 Airway Equipment and Method: Stylet and Oral airway Placement Confirmation: ETT inserted through vocal cords under direct vision,  positive ETCO2 and breath sounds checked- equal and bilateral Secured at: 23 cm Tube secured with: Tape Dental Injury: Teeth and Oropharynx as per pre-operative assessment
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
## 2019-01-24 NOTE — Progress Notes (Signed)
OT Cancellation Note  Patient Details Name: Keith Warren MRN: [id number] DOB: 02/15/1956   Cancelled Treatment:    Reason Eval/Treat Not Completed: Patient at procedure or test/ unavailable Pt in OR. Will continue to follow as available and appropriate to initiate OT POC.   Zenovia Jarred, MSOT, OTR/L Behavioral Health OT/ Acute Relief OT [facility] Office: [phone]  Zenovia Jarred 01/24/2019, 10:39 AM

## 2019-01-24 NOTE — Transfer of Care (Signed)
Immediate Anesthesia Transfer of Care Note  Patient: Keith Warren  Procedure(s) Performed: OPEN REDUCTION INTERNAL FIXATION (ORIF) CLAVICULAR FRACTURE (Left )  Patient Location: PACU  Anesthesia Type:General  Level of Consciousness: awake and patient cooperative  Airway & Oxygen Therapy: Patient Spontanous Breathing and Patient connected to face mask oxygen  Post-op Assessment: Report given to RN and Post -op Vital signs reviewed and stable  Post vital signs: Reviewed and stable  Last Vitals:  Vitals Value Taken Time  BP    Temp    Pulse 71 01/24/19 1005  Resp 19 01/24/19 1005  SpO2 93 % 01/24/19 1005  Vitals shown include unvalidated device data.  Last Pain:  Vitals:   01/24/19 0500  TempSrc:   PainSc: 7          Complications: No apparent anesthesia complications
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
## 2019-01-24 NOTE — Progress Notes (Signed)
PT Cancellation Note  Patient Details Name: Keith Warren MRN: [id number] DOB: Jun 03, 1955   Cancelled Treatment:    Reason Eval/Treat Not Completed: Patient at procedure or test/unavailable Pt in OR. Will follow up as schedule allows.   Leighton Ruff, PT, DPT  Acute Rehabilitation Services  Pager: [phone] Office: [phone]    Rudean Hitt 01/24/2019, 10:28 AM

## 2019-01-24 NOTE — Anesthesia Postprocedure Evaluation (Signed)
Anesthesia Post Note  Patient: Keith Warren  Procedure(s) Performed: OPEN REDUCTION INTERNAL FIXATION (ORIF) CLAVICULAR FRACTURE (Left )     Patient location during evaluation: PACU Anesthesia Type: General Level of consciousness: awake and alert, patient cooperative and oriented Pain management: pain level controlled Vital Signs Assessment: post-procedure vital signs reviewed and stable Respiratory status: spontaneous breathing, nonlabored ventilation, respiratory function stable and patient connected to nasal cannula oxygen Cardiovascular status: blood pressure returned to baseline and stable Postop Assessment: no apparent nausea or vomiting Anesthetic complications: no    Last Vitals:  Vitals:   01/24/19 1152 01/24/19 1200  BP: (!) 174/81   Pulse: 62 (!) 58  Resp: 18 18  Temp:    SpO2: 94% 93%    Last Pain:  Vitals:   01/24/19 1145  TempSrc:   PainSc: 1                  Kareli Hossain,E. Jamahl Lemmons

## 2019-01-25 ENCOUNTER — Inpatient Hospital Stay (HOSPITAL_COMMUNITY): Payer: Medicare Other

## 2019-01-25 ENCOUNTER — Encounter (HOSPITAL_COMMUNITY): Payer: Self-pay | Admitting: *Deleted

## 2019-01-25 LAB — BASIC METABOLIC PANEL
Anion gap: 8 (ref 5–15)
BUN: 15 mg/dL (ref 8–23)
CO2: 26 mmol/L (ref 22–32)
Calcium: 9.1 mg/dL (ref 8.9–10.3)
Chloride: 107 mmol/L (ref 98–111)
Creatinine, Ser: 1.22 mg/dL (ref 0.61–1.24)
GFR calc Af Amer: >60 mL/min (ref >60)
GFR calc non Af Amer: >60 mL/min (ref >60)
Glucose, Bld: 122 mg/dL — ABNORMAL HIGH (ref 70–99)
Potassium: 4.1 mmol/L (ref 3.5–5.1)
Sodium: 141 mmol/L (ref 135–145)

## 2019-01-25 LAB — CBC
HCT: 33.4 % — ABNORMAL LOW (ref 39.0–52.0)
Hemoglobin: 11.6 g/dL — ABNORMAL LOW (ref 13.0–17.0)
MCH: 32.8 pg (ref 26.0–34.0)
MCHC: 34.7 g/dL (ref 30.0–36.0)
MCV: 94.4 fL (ref 80.0–100.0)
Platelets: 251 10*3/uL (ref 150–400)
RBC: 3.54 MIL/uL — ABNORMAL LOW (ref 4.22–5.81)
RDW: 12.2 % (ref 11.5–15.5)
WBC: 13.1 10*3/uL — ABNORMAL HIGH (ref 4.0–10.5)
nRBC: 0 % (ref 0.0–0.2)

## 2019-01-25 LAB — VITAMIN D 25 HYDROXY (VIT D DEFICIENCY, FRACTURES): Vit D, 25-Hydroxy: 35.1 ng/mL (ref 30.0–100.0)
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
## 2019-01-25 NOTE — Progress Notes (Signed)
Orthopedic Tech Progress Note Patient Details:  Keith Warren 09/18/1955 [id number]  Ortho Devices Type of Ortho Device: Thumb spica splint Ortho Device/Splint Location: right Ortho Device/Splint Interventions: Application   Post Interventions Patient Tolerated: Well Instructions Provided: Care of device   [place] Pink 01/25/2019, 1:04 PM
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
## 2019-01-26 ENCOUNTER — Inpatient Hospital Stay (HOSPITAL_COMMUNITY): Payer: Medicare Other

## 2019-01-26 LAB — CBC
HCT: 29.8 % — ABNORMAL LOW (ref 39.0–52.0)
Hemoglobin: 9.7 g/dL — ABNORMAL LOW (ref 13.0–17.0)
MCH: 31.4 pg (ref 26.0–34.0)
MCHC: 32.6 g/dL (ref 30.0–36.0)
MCV: 96.4 fL (ref 80.0–100.0)
Platelets: 254 10*3/uL (ref 150–400)
RBC: 3.09 MIL/uL — ABNORMAL LOW (ref 4.22–5.81)
RDW: 12.5 % (ref 11.5–15.5)
WBC: 8.9 10*3/uL (ref 4.0–10.5)
nRBC: 0 % (ref 0.0–0.2)

## 2019-01-26 LAB — BASIC METABOLIC PANEL
Anion gap: 7 (ref 5–15)
BUN: 17 mg/dL (ref 8–23)
CO2: 27 mmol/L (ref 22–32)
Calcium: 8.8 mg/dL — ABNORMAL LOW (ref 8.9–10.3)
Chloride: 107 mmol/L (ref 98–111)
Creatinine, Ser: 1.3 mg/dL — ABNORMAL HIGH (ref 0.61–1.24)
GFR calc Af Amer: >60 mL/min (ref >60)
GFR calc non Af Amer: 57 mL/min — ABNORMAL LOW (ref >60)
Glucose, Bld: 100 mg/dL — ABNORMAL HIGH (ref 70–99)
Potassium: 4.1 mmol/L (ref 3.5–5.1)
Sodium: 141 mmol/L (ref 135–145)
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
## 2019-01-26 NOTE — Plan of Care (Signed)
  Problem: Education: Goal: Knowledge of General Education information will improve Description: Including pain rating scale, medication(s)/side effects and non-pharmacologic comfort measures Outcome: Progressing   Problem: Clinical Measurements: Goal: Respiratory complications will improve Outcome: Progressing   Problem: Elimination: Goal: Will not experience complications related to bowel motility Outcome: Progressing   Problem: Pain Managment: Goal: General experience of comfort will improve Outcome: Progressing
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
## 2019-01-27 MED ORDER — OXYCODONE HCL 5 MG PO TABS: 5.0000 mg | ORAL_TABLET | Freq: Four times a day (QID) | ORAL | 0 refills | Status: DC | PRN

## 2019-01-27 NOTE — Progress Notes (Signed)
   01/27/19 1141  AVS Discharge Documentation  AVS Discharge Instructions Including Medications Provided to patient/caregiver  Name of Person Receiving AVS Discharge Instructions Including Medications Keith Warren  Name of Clinician That Reviewed AVS Discharge Instructions Including Medications Dineen Kid, RN
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
## 2019-01-27 NOTE — Plan of Care (Signed)
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
## 2019-01-27 NOTE — Discharge Instructions (Signed)
Clavicle Fracture A clavicle fracture is a break in the long bone that connects the shoulder to the chest wall (clavicle). The clavicle is also called the collarbone. A clavicle fracture is a common injury that can happen at any age. What are the causes? Common causes of a clavicle fracture include:  A hard, direct hit (blow) to the shoulder.  A car accident.  A fall, especially if you try to break your fall with an outstretched arm. What increases the risk? You are more likely to develop this condition if:  You are younger than 10 or older than 75. Most clavicle fractures happen to people who are younger than 25.  You are a male.  You play contact sports. What are the signs or symptoms? Symptoms of this condition include:  Pain.  Difficulty moving the arm.  A shoulder that drops downward and forward.  Pain when trying to lift the shoulder.  Bruising, swelling, and tenderness over the clavicle.  A grinding noise when you try to move the shoulder.  A bump over the clavicle. How is this diagnosed? This condition is diagnosed based on:  Your medical history.  A physical exam.  X-rays to determine the position of your clavicle. How is this treated? Treatment for this condition depends on the position of your clavicle after the fracture:  If the broken ends of the bone are not out of place, your health care provider may put your arm in a sling.  If the broken ends of the bone are out of place, you may need surgery. Surgery may involve placing screws, pins, or plates to keep your clavicle stable while it heals. When your health care provider thinks your fracture has healed enough, you may have to do physical therapy to regain normal movement and build up your arm strength. Follow these instructions at home: If you have a sling:   Wear the sling as told by your health care provider. Remove it only as told by your health care provider.  Loosen the sling if your fingers  tingle, become numb, or turn cold and blue.  Do not lift your arm. Keep it across your chest.  Keep the sling clean.  Ask your health care provider if you may remove the sling for bathing. ? If your sling is not waterproof, do not let it get wet. Cover the sling with a watertight covering if you take a bath or a shower while wearing it. ? If you may remove your sling when you take a bath or a shower, keep your shoulder in the same position as when the sling is on. Managing pain, stiffness, and swelling   If directed, put ice on the injured area: ? If you have a removable sling, remove it as told by your health care provider. ? Put ice in a plastic bag. ? Place a towel between your skin and the bag. ? Leave the ice on for 20 minutes, 2-3 times a day. Activity  Avoid activities that make your symptoms worse for 4-6 weeks, or as long as directed.  Ask your health care provider when it is safe for you to drive.  Do exercises as told by your health care provider. General instructions  Do not use any products that contain nicotine or tobacco, such as cigarettes and e-cigarettes. These can delay bone healing. If you need help quitting, ask your health care provider.  Take over-the-counter and prescription medicines only as told by your health care provider.  Keep all  follow-up visits as told by your health care provider. This is important. Contact a health care provider if:  Your medicine is not helping to relieve pain and swelling. Get help right away if:  Your arm is numb, cold, or pale, even when your splint is loose. Summary  The clavicle, also called the collarbone, is the long bone that connects the shoulder to the chest wall.  Treatment for this condition depends on the position of your clavicle after the fracture.  You may need to do physical therapy after your injury has healed enough.  If you have a sling, wear the sling as told by your health care provider. This  information is not intended to replace advice given to you by your health care provider. Make sure you discuss any questions you have with your health care provider. Document Released: 01/26/2005 Document Revised: 03/05/2018 Document Reviewed: 03/07/2016 Elsevier Patient Education  2020 [employer].
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
## 2019-01-28 ENCOUNTER — Encounter: Payer: Self-pay | Admitting: Student

## 2019-01-28 DIAGNOSIS — S2249XA Multiple fractures of ribs, unspecified side, initial encounter for closed fracture: Secondary | ICD-10-CM | POA: Insufficient documentation

## 2019-01-28 DIAGNOSIS — S270XXA Traumatic pneumothorax, initial encounter: Secondary | ICD-10-CM | POA: Insufficient documentation

## 2019-01-28 DIAGNOSIS — S060X9A Concussion with loss of consciousness of unspecified duration, initial encounter: Secondary | ICD-10-CM | POA: Insufficient documentation

## 2019-01-28 DIAGNOSIS — S72112A Displaced fracture of greater trochanter of left femur, initial encounter for closed fracture: Secondary | ICD-10-CM | POA: Insufficient documentation
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
## 2024-01-01 DEATH — deceased
